# Patient Record
Sex: Male | Born: 1965 | Race: White | Hispanic: No | Smoking: Current some day smoker
Health system: Southern US, Community
[De-identification: ages and names within clinical notes are randomized; demographics above are authoritative.]

## PROBLEM LIST (undated history)

## (undated) HISTORY — PX: APPENDECTOMY: SHX54

---

## 2002-08-29 ENCOUNTER — Emergency Department (HOSPITAL_COMMUNITY): Admission: EM | Admit: 2002-08-29 | Discharge: 2002-08-29 | Payer: Self-pay | Admitting: Emergency Medicine

## 2003-09-24 ENCOUNTER — Encounter: Payer: Self-pay | Admitting: Emergency Medicine

## 2003-09-24 ENCOUNTER — Emergency Department (HOSPITAL_COMMUNITY): Admission: EM | Admit: 2003-09-24 | Discharge: 2003-09-24 | Payer: Self-pay | Admitting: Emergency Medicine

## 2004-07-24 ENCOUNTER — Emergency Department (HOSPITAL_COMMUNITY): Admission: EM | Admit: 2004-07-24 | Discharge: 2004-07-24 | Payer: Self-pay | Admitting: Emergency Medicine

## 2006-02-27 ENCOUNTER — Emergency Department (HOSPITAL_COMMUNITY): Admission: EM | Admit: 2006-02-27 | Discharge: 2006-02-27 | Payer: Self-pay | Admitting: Family Medicine

## 2008-12-29 ENCOUNTER — Emergency Department (HOSPITAL_COMMUNITY): Admission: EM | Admit: 2008-12-29 | Discharge: 2008-12-29 | Payer: Self-pay | Admitting: Emergency Medicine

## 2014-06-03 ENCOUNTER — Emergency Department (HOSPITAL_COMMUNITY)
Admission: EM | Admit: 2014-06-03 | Discharge: 2014-06-04 | Disposition: A | Discharge status: Home / Self Care | Payer: Medicaid Other | Attending: Emergency Medicine | Admitting: Emergency Medicine

## 2014-06-03 ENCOUNTER — Encounter (HOSPITAL_COMMUNITY): Payer: Self-pay | Admitting: Emergency Medicine

## 2014-06-03 DIAGNOSIS — R110 Nausea: Secondary | ICD-10-CM | POA: Insufficient documentation

## 2014-06-03 DIAGNOSIS — Z79899 Other long term (current) drug therapy: Secondary | ICD-10-CM | POA: Insufficient documentation

## 2014-06-03 DIAGNOSIS — Z9089 Acquired absence of other organs: Secondary | ICD-10-CM | POA: Insufficient documentation

## 2014-06-03 DIAGNOSIS — R630 Anorexia: Secondary | ICD-10-CM | POA: Insufficient documentation

## 2014-06-03 DIAGNOSIS — R197 Diarrhea, unspecified: Secondary | ICD-10-CM | POA: Insufficient documentation

## 2014-06-03 LAB — CBC WITH DIFFERENTIAL/PLATELET
Basophils Absolute: 0 10*3/uL (ref 0.0–0.1)
Basophils Relative: 1 % (ref 0–1)
Eosinophils Absolute: 0.1 10*3/uL (ref 0.0–0.7)
Eosinophils Relative: 2 % (ref 0–5)
HCT: 42.9 % (ref 39.0–52.0)
Hemoglobin: 15.4 g/dL (ref 13.0–17.0)
Lymphocytes Relative: 31 % (ref 12–46)
Lymphs Abs: 1.2 10*3/uL (ref 0.7–4.0)
MCH: 29.8 pg (ref 26.0–34.0)
MCHC: 35.9 g/dL (ref 30.0–36.0)
MCV: 83 fL (ref 78.0–100.0)
Monocytes Absolute: 0.5 10*3/uL (ref 0.1–1.0)
Monocytes Relative: 13 % — ABNORMAL HIGH (ref 3–12)
Neutro Abs: 2.1 10*3/uL (ref 1.7–7.7)
Neutrophils Relative %: 53 % (ref 43–77)
Platelets: 250 10*3/uL (ref 150–400)
RBC: 5.17 MIL/uL (ref 4.22–5.81)
RDW: 12.8 % (ref 11.5–15.5)
WBC: 3.8 10*3/uL — ABNORMAL LOW (ref 4.0–10.5)

## 2014-06-03 LAB — I-STAT CHEM 8, ED
BUN: 10 mg/dL (ref 6–23)
BUN: 10 mg/dL (ref 6–23)
CALCIUM ION: 1.08 mmol/L — AB (ref 1.12–1.23)
CREATININE: 1 mg/dL (ref 0.50–1.35)
Calcium, Ion: 1.19 mmol/L (ref 1.12–1.23)
Chloride: 99 mEq/L (ref 96–112)
Chloride: 104 mEq/L (ref 96–112)
Creatinine, Ser: 1 mg/dL (ref 0.50–1.35)
Glucose, Bld: 100 mg/dL — ABNORMAL HIGH (ref 70–99)
Glucose, Bld: 83 mg/dL (ref 70–99)
HCT: 45 % (ref 39.0–52.0)
HCT: 45 % (ref 39.0–52.0)
Hemoglobin: 15.3 g/dL (ref 13.0–17.0)
Hemoglobin: 15.3 g/dL (ref 13.0–17.0)
Potassium: 3.5 mEq/L — ABNORMAL LOW (ref 3.7–5.3)
Potassium: 3.3 mEq/L — ABNORMAL LOW (ref 3.7–5.3)
Sodium: 140 mEq/L (ref 137–147)
Sodium: 138 mEq/L (ref 137–147)
TCO2: 26 mmol/L (ref 0–100)
TCO2: 27 mmol/L (ref 0–100)

## 2014-06-03 MED ORDER — KETOROLAC TROMETHAMINE 30 MG/ML IJ SOLN
30.0000 mg | Freq: Once | INTRAMUSCULAR | Status: AC
  Administered 2014-06-03: 30 mg via INTRAVENOUS
  Filled 2014-06-03: qty 1

## 2014-06-03 MED ORDER — SODIUM CHLORIDE 0.9 % IV BOLUS (SEPSIS)
1000.0000 mL | Freq: Once | INTRAVENOUS | Status: AC
  Administered 2014-06-03: 1000 mL via INTRAVENOUS

## 2014-06-03 NOTE — ED Notes (Signed)
PT reports n/v/d the morning after eating out hibachi on [place]. PT began to feel a tiny bit better and has held down pudding and water today. Stomach still very sensitive and PT states still nauseous. Reports having gotten really hot a few times but unsure of fever. PT says he still has mild nausea and and abd cramping but no only diarrhea x 1 today

## 2014-06-03 NOTE — ED Provider Notes (Signed)
CSN: [id number]     Arrival date & time 06/03/14  2000 History   First MD Initiated Contact with Patient 06/03/14 2133     Chief Complaint  Patient presents with  . Abdominal Pain     (Consider location/radiation/quality/duration/timing/severity/associated sxs/prior Treatment) Patient is a 48 y.o. male presenting with abdominal pain. The history is provided by the patient.  Abdominal Pain Pain location:  Generalized Pain quality: aching and cramping   Pain severity:  Mild Onset quality:  Gradual Duration:  3 days Timing:  Constant Context: not diet changes and not trauma   Relieved by:  None tried Worsened by:  Nothing tried Ineffective treatments:  None tried Associated symptoms: anorexia, diarrhea and nausea   Associated symptoms: no dysuria and no vomiting     History reviewed. No pertinent past medical history. Past Surgical History  Procedure Laterality Date  . Appendectomy     History reviewed. No pertinent family history. History  Substance Use Topics  . Smoking status: Never Smoker   . Smokeless tobacco: Not on file  . Alcohol Use: No    Review of Systems  Gastrointestinal: Positive for nausea, abdominal pain, diarrhea and anorexia. Negative for vomiting.  Genitourinary: Negative for dysuria.  All other systems reviewed and are negative.     Allergies  Review of patient's allergies indicates no known allergies.  Home Medications   Prior to Admission medications   Medication Sig Start Date End Date Taking? Authorizing Provider  BIOTIN PO Take 10,000 mg by mouth daily.   Yes Historical Provider, MD  Emollient (ROC RETINOL CORREXION EYE) CREA Apply 1 application topically at bedtime. Apply to eye lids and under eyes   Yes Historical Provider, MD  minoxidil (ROGAINE) 2 % external solution Apply 1 application topically at bedtime.   Yes Historical Provider, MD  Multiple Vitamin (MULTIVITAMIN WITH MINERALS) TABS tablet Take 1 tablet by mouth daily with  supper.   Yes Historical Provider, MD  OVER THE COUNTER MEDICATION Take 1 capsule by mouth 2 (two) times daily before a meal. Garcinia Cambogia:  Plant extract diet aid (fat blocker)   Yes Historical Provider, MD  OVER THE COUNTER MEDICATION Place 1 spray into both nostrils daily as needed (congestion). Decongestant nasal spray otc   Yes Historical Provider, MD   BP 114/71  Pulse 91  Temp(Src) 98.4 F (36.9 C) (Oral)  Resp 15  Wt 190 lb 9 oz (86.439 kg)  SpO2 98% Physical Exam  Vitals reviewed. Constitutional: He appears well-developed and well-nourished.  HENT:  Head: Normocephalic.  Eyes: Pupils are equal, round, and reactive to light.  Neck: Normal range of motion.  Cardiovascular: Normal rate.   Pulmonary/Chest: Effort normal.  Abdominal: Soft. Bowel sounds are normal. He exhibits no distension. There is tenderness.  Neurological: He is alert.  Skin: Skin is warm. No rash noted.    ED Course  Procedures (including critical care time) Labs Review Labs Reviewed  CBC WITH DIFFERENTIAL - Abnormal; Notable for the following:    WBC 3.8 (*)    Monocytes Relative 13 (*)    All other components within normal limits  I-STAT CHEM 8, ED - Abnormal; Notable for the following:    Potassium 3.5 (*)    Glucose, Bld 100 (*)    All other components within normal limits  I-STAT CHEM 8, ED - Abnormal; Notable for the following:    Potassium 3.3 (*)    Calcium, Ion 1.08 (*)    All other components within normal  limits    Imaging Review No results found.   EKG Interpretation None      MDM  Patient/within normal, parameters.  He's been given IV fluid, and Toradol.  He is feeling, better, and requesting food, and fluids.  I anticipate discharge home after 1 L of fluid Final diagnoses:  Diarrhea         Arman FilterGail K Schulz, NP 06/03/14 2309  Arman FilterGail K Schulz, NP 06/03/14 2351

## 2014-06-03 NOTE — ED Notes (Signed)
Patient with abdominal pain with nausea and diarrhea.  Patient states that he has had this for about 3 days.

## 2014-06-15 NOTE — ED Provider Notes (Signed)
Medical screening examination/treatment/procedure(s) were performed by non-physician practitioner and as supervising physician I was immediately available for consultation/collaboration.   EKG Interpretation None       Vanetta MuldersScott Zackowski, MD 06/15/14 2202

## 2015-07-09 ENCOUNTER — Emergency Department (HOSPITAL_COMMUNITY)
Admission: EM | Admit: 2015-07-09 | Discharge: 2015-07-09 | Disposition: A | Discharge status: Home / Self Care | Payer: Medicaid Other | Attending: Emergency Medicine | Admitting: Emergency Medicine

## 2015-07-09 ENCOUNTER — Emergency Department (HOSPITAL_COMMUNITY): Payer: Medicaid Other

## 2015-07-09 ENCOUNTER — Encounter (HOSPITAL_COMMUNITY): Payer: Self-pay | Admitting: Nurse Practitioner

## 2015-07-09 DIAGNOSIS — R05 Cough: Secondary | ICD-10-CM | POA: Diagnosis present

## 2015-07-09 DIAGNOSIS — Z720 Tobacco use: Secondary | ICD-10-CM | POA: Insufficient documentation

## 2015-07-09 DIAGNOSIS — J40 Bronchitis, not specified as acute or chronic: Secondary | ICD-10-CM

## 2015-07-09 DIAGNOSIS — J209 Acute bronchitis, unspecified: Secondary | ICD-10-CM | POA: Diagnosis not present

## 2015-07-09 MED ORDER — BENZONATATE 200 MG PO CAPS: 200.0000 mg | ORAL_CAPSULE | Freq: Three times a day (TID) | ORAL | Status: AC | PRN

## 2015-07-09 MED ORDER — ALBUTEROL SULFATE HFA 108 (90 BASE) MCG/ACT IN AERS
2.0000 | INHALATION_SPRAY | RESPIRATORY_TRACT | Status: DC | PRN
  Administered 2015-07-09: 2 via RESPIRATORY_TRACT
  Filled 2015-07-09: qty 6.7

## 2015-07-09 MED ORDER — AZITHROMYCIN 250 MG PO TABS: 250.0000 mg | ORAL_TABLET | Freq: Every day | ORAL | Status: AC

## 2015-07-09 MED ORDER — HYDROCOD POLST-CPM POLST ER 10-8 MG/5ML PO SUER
5.0000 mL | Freq: Once | ORAL | Status: DC
  Filled 2015-07-09: qty 5

## 2015-07-09 MED ORDER — DEXTROMETHORPHAN-GUAIFENESIN 10-100 MG/5ML PO LIQD: 5.0000 mL | Freq: Every evening | ORAL | Status: AC | PRN

## 2015-07-09 NOTE — ED Notes (Signed)
He states hes had a dry cough for past month. Denies pain, fevers, weight loss, other symptoms. A&Ox4, resp e/u

## 2015-07-09 NOTE — Discharge Instructions (Signed)
Follow up with your doctor, return here as needed.  °

## 2015-07-09 NOTE — ED Provider Notes (Signed)
CSN: [id number]     Arrival date & time 07/09/15  1713 History  This chart was scribed for Kerrie Buffalo, NP working with Pricilla Loveless, MD by Placido Sou, ED Scribe. This patient was seen in room TR10C/TR10C and the patient's care was started at 7:05 PM.   Chief Complaint  Patient presents with  . Cough   The history is provided by the patient. No language interpreter was used.    HPI Comments: Jimmy Burgess is a 49 y.o. male, with a hx of smoking and bronchitis, who presents to the Emergency Department complaining of a moderate, intermittent, unproductive cough that began in February and began worsening 2 weeks ago. He notes drinking an "immune boosting" tea recently and felt a worsening of his symptoms s/p and further notes an associated sore throat due to the heavy cough and an intermittent SOB when performing certain actions such as bending over to tie his shoes. He also notes sometimes coughing up small amounts of blood in his mucous due to occasional bouts of epistaxis that drain down his throat. Pt notes that s/p ambulating once awake in the morning his symptoms tend to worsen. Pt also notes living in an old home and is concerned that the air quality in the home could be unsatisfactory and possibly exacerbating his symptoms. He denies any change in his symptoms when exposed to heat or cold. He notes a hx of smoking but denies any current regular smoking. Pt denies having seen his PCP for these symptoms. He denies weight loss, chills, ear ache or fever.   History reviewed. No pertinent past medical history. Past Surgical History  Procedure Laterality Date  . Appendectomy     History reviewed. No pertinent family history. History  Substance Use Topics  . Smoking status: Current Some Day Smoker    Types: Cigarettes  . Smokeless tobacco: Not on file  . Alcohol Use: No    Review of Systems  Constitutional: Negative for fever and chills.  HENT: Positive for sore throat. Negative for ear  pain.   Respiratory: Positive for cough, shortness of breath and wheezing.   Endocrine: Negative for cold intolerance and heat intolerance.  All other systems reviewed and are negative.   Allergies  Review of patient's allergies indicates no known allergies.  Home Medications   Prior to Admission medications   Medication Sig Start Date End Date Taking? Authorizing Provider  azithromycin (ZITHROMAX) 250 MG tablet Take 1 tablet (250 mg total) by mouth daily. Take first 2 tablets together, then 1 every day until finished. 07/09/15   Ivoree Felmlee Orlene Och, NP  benzonatate (TESSALON) 200 MG capsule Take 1 capsule (200 mg total) by mouth 3 (three) times daily as needed for cough. 07/09/15   Michael Ventresca Orlene Och, NP  BIOTIN PO Take 10,000 mg by mouth daily.    Historical Provider, MD  dextromethorphan-guaiFENesin (TUSSIN DM) 10-100 MG/5ML liquid Take 5 mLs by mouth at bedtime as needed and may repeat dose one time if needed for cough. 07/09/15   Hassen Bruun Orlene Och, NP  Emollient (ROC RETINOL CORREXION EYE) CREA Apply 1 application topically at bedtime. Apply to eye lids and under eyes    Historical Provider, MD  minoxidil (ROGAINE) 2 % external solution Apply 1 application topically at bedtime.    Historical Provider, MD  Multiple Vitamin (MULTIVITAMIN WITH MINERALS) TABS tablet Take 1 tablet by mouth daily with supper.    Historical Provider, MD  OVER THE COUNTER MEDICATION Take 1 capsule by mouth 2 (  two) times daily before a meal. Garcinia Cambogia:  Plant extract diet aid (fat blocker)    Historical Provider, MD  OVER THE COUNTER MEDICATION Place 1 spray into both nostrils daily as needed (congestion). Decongestant nasal spray otc    Historical Provider, MD   BP 111/78 mmHg  Pulse 108  Temp(Src) 98.4 F (36.9 C) (Oral)  Resp 20  SpO2 94% Physical Exam  Constitutional: He is oriented to person, place, and time. He appears well-developed and well-nourished. No distress.  HENT:  Head: Normocephalic and atraumatic.   Mouth/Throat: Oropharynx is clear and moist.  Eyes: Conjunctivae and EOM are normal. Pupils are equal, round, and reactive to light.  Neck: Normal range of motion. Neck supple. No tracheal deviation present.  Cardiovascular: Normal rate.   Pulmonary/Chest: Effort normal. No respiratory distress. He has wheezes.  Occasional wheezing;   Abdominal: Soft.  Musculoskeletal: Normal range of motion.  Neurological: He is alert and oriented to person, place, and time.  Skin: Skin is warm and dry.  Psychiatric: He has a normal mood and affect. His behavior is normal.  Nursing note and vitals reviewed.   ED Course  Procedures  DIAGNOSTIC STUDIES: Oxygen Saturation is 94% on RA, adequate by my interpretation.    COORDINATION OF CARE: 7:16 PM Discussed treatment plan with pt at bedside and pt agreed to plan.  Labs Review Labs Reviewed - No data to display  Imaging Review Dg Chest 2 View  07/09/2015   CLINICAL DATA:  Nonproductive cough, headache, shortness of breath, and chest pressure for 1 month. Former smoker.  EXAM: CHEST  2 VIEW  COMPARISON:  None.  FINDINGS: Cardiomediastinal silhouette is within normal limits. Minimal atelectasis or scarring is noted in the lung bases. No confluent airspace opacity, pulmonary edema, pleural effusion, or pneumothorax is identified. S shaped thoracolumbar scoliosis is noted.  IMPRESSION: No active cardiopulmonary disease.   Electronically Signed   By: Sebastian Ache   On: 07/09/2015 18:03     MDM  49 y.o. male with cough and wheezing stable for d/c without respiratory distress, O2 SAT 94% on R/A. Will treat for bronchitis with albuterol inhaler, z-pak, tessalon and Tussionex at bedtime. He will follow up with his doctor this week. He will return here as needed.   Final diagnoses:  Bronchitis   I personally performed the services described in this documentation, which was scribed in my presence. The recorded information has been reviewed and is  accurate.    [address] [place], [place] 07/10/15 1730  Pricilla Loveless, MD 07/12/15 [phone]

## 2015-07-09 NOTE — ED Notes (Signed)
Patient transported to X-ray 

## 2016-04-04 ENCOUNTER — Encounter: Payer: Self-pay | Admitting: Internal Medicine

## 2016-07-09 ENCOUNTER — Emergency Department (HOSPITAL_COMMUNITY): Payer: No Typology Code available for payment source

## 2016-07-09 ENCOUNTER — Encounter (HOSPITAL_COMMUNITY): Payer: Self-pay | Admitting: Emergency Medicine

## 2016-07-09 ENCOUNTER — Emergency Department (HOSPITAL_COMMUNITY)
Admission: EM | Admit: 2016-07-09 | Discharge: 2016-07-09 | Disposition: A | Discharge status: Home / Self Care | Payer: No Typology Code available for payment source | Attending: Emergency Medicine | Admitting: Emergency Medicine

## 2016-07-09 DIAGNOSIS — S0990XA Unspecified injury of head, initial encounter: Secondary | ICD-10-CM | POA: Diagnosis present

## 2016-07-09 DIAGNOSIS — S0181XA Laceration without foreign body of other part of head, initial encounter: Secondary | ICD-10-CM | POA: Diagnosis not present

## 2016-07-09 DIAGNOSIS — Y999 Unspecified external cause status: Secondary | ICD-10-CM | POA: Insufficient documentation

## 2016-07-09 DIAGNOSIS — F17210 Nicotine dependence, cigarettes, uncomplicated: Secondary | ICD-10-CM | POA: Insufficient documentation

## 2016-07-09 DIAGNOSIS — Y92410 Unspecified street and highway as the place of occurrence of the external cause: Secondary | ICD-10-CM | POA: Insufficient documentation

## 2016-07-09 DIAGNOSIS — Y939 Activity, unspecified: Secondary | ICD-10-CM | POA: Insufficient documentation

## 2016-07-09 DIAGNOSIS — N400 Enlarged prostate without lower urinary tract symptoms: Secondary | ICD-10-CM | POA: Diagnosis not present

## 2016-07-09 DIAGNOSIS — IMO0002 Reserved for concepts with insufficient information to code with codable children: Secondary | ICD-10-CM

## 2016-07-09 LAB — CBC WITH DIFFERENTIAL/PLATELET
BASOS PCT: 2 %
Basophils Absolute: 0.1 10*3/uL (ref 0.0–0.1)
Eosinophils Absolute: 0.1 10*3/uL (ref 0.0–0.7)
Eosinophils Relative: 2 %
HEMATOCRIT: 43.6 % (ref 39.0–52.0)
HEMOGLOBIN: 15.2 g/dL (ref 13.0–17.0)
LYMPHS PCT: 36 %
Lymphs Abs: 2.1 10*3/uL (ref 0.7–4.0)
MCH: 29.6 pg (ref 26.0–34.0)
MCHC: 34.9 g/dL (ref 30.0–36.0)
MCV: 85 fL (ref 78.0–100.0)
MONO ABS: 0.7 10*3/uL (ref 0.1–1.0)
MONOS PCT: 11 %
NEUTROS ABS: 3 10*3/uL (ref 1.7–7.7)
NEUTROS PCT: 49 %
Platelets: 281 10*3/uL (ref 150–400)
RBC: 5.13 MIL/uL (ref 4.22–5.81)
RDW: 12.6 % (ref 11.5–15.5)
WBC: 6 10*3/uL (ref 4.0–10.5)

## 2016-07-09 LAB — COMPREHENSIVE METABOLIC PANEL
ALBUMIN: 4.1 g/dL (ref 3.5–5.0)
ALK PHOS: 57 U/L (ref 38–126)
ALT: 26 U/L (ref 17–63)
ANION GAP: 10 (ref 5–15)
AST: 24 U/L (ref 15–41)
BILIRUBIN TOTAL: 1 mg/dL (ref 0.3–1.2)
BUN: 12 mg/dL (ref 6–20)
CALCIUM: 8.8 mg/dL — AB (ref 8.9–10.3)
CO2: 21 mmol/L — ABNORMAL LOW (ref 22–32)
Chloride: 106 mmol/L (ref 101–111)
Creatinine, Ser: 1.21 mg/dL (ref 0.61–1.24)
GFR calc Af Amer: >60 mL/min (ref >60)
Glucose, Bld: 142 mg/dL — ABNORMAL HIGH (ref 65–99)
Potassium: 3.2 mmol/L — ABNORMAL LOW (ref 3.5–5.1)
Sodium: 137 mmol/L (ref 135–145)
TOTAL PROTEIN: 6.2 g/dL — AB (ref 6.5–8.1)

## 2016-07-09 LAB — LIPASE, BLOOD: LIPASE: 196 U/L — AB (ref 11–51)

## 2016-07-09 MED ORDER — TETANUS-DIPHTH-ACELL PERTUSSIS 5-2.5-18.5 LF-MCG/0.5 IM SUSP
0.5000 mL | Freq: Once | INTRAMUSCULAR | Status: AC
  Administered 2016-07-09: 0.5 mL via INTRAMUSCULAR
  Filled 2016-07-09: qty 0.5

## 2016-07-09 MED ORDER — FENTANYL CITRATE (PF) 100 MCG/2ML IJ SOLN
INTRAMUSCULAR | Status: AC
  Filled 2016-07-09: qty 2

## 2016-07-09 MED ORDER — FENTANYL CITRATE (PF) 100 MCG/2ML IJ SOLN
100.0000 ug | Freq: Once | INTRAMUSCULAR | Status: AC
  Administered 2016-07-09: 100 ug via INTRAVENOUS

## 2016-07-09 MED ORDER — TETANUS-DIPHTHERIA TOXOIDS TD 5-2 LFU IM INJ: 0.5000 mL | INJECTION | Freq: Once | INTRAMUSCULAR | Status: DC

## 2016-07-09 MED ORDER — IOPAMIDOL (ISOVUE-300) INJECTION 61%
INTRAVENOUS | Status: AC
  Administered 2016-07-09: 80 mL via INTRAVENOUS
  Filled 2016-07-09: qty 100

## 2016-07-09 MED ORDER — LIDOCAINE-EPINEPHRINE (PF) 2 %-1:200000 IJ SOLN
20.0000 mL | Freq: Once | INTRAMUSCULAR | Status: AC
  Administered 2016-07-09: 20 mL
  Filled 2016-07-09: qty 20

## 2016-07-09 MED ORDER — CYCLOBENZAPRINE HCL 10 MG PO TABS: 10.0000 mg | ORAL_TABLET | Freq: Two times a day (BID) | ORAL | 0 refills | Status: AC | PRN

## 2016-07-09 MED ORDER — SODIUM CHLORIDE 0.9 % IV BOLUS (SEPSIS)
1000.0000 mL | Freq: Once | INTRAVENOUS | Status: AC
Start: 2016-07-09 — End: 2016-07-09
  Administered 2016-07-09: 1000 mL via INTRAVENOUS

## 2016-07-09 MED ORDER — BACITRACIN ZINC 500 UNIT/GM EX OINT: 1.0000 "application " | TOPICAL_OINTMENT | Freq: Two times a day (BID) | CUTANEOUS | 0 refills | Status: AC

## 2016-07-09 MED ORDER — BACITRACIN ZINC 500 UNIT/GM EX OINT
TOPICAL_OINTMENT | Freq: Once | CUTANEOUS | Status: AC
Start: 2016-07-09 — End: 2016-07-09
  Administered 2016-07-09: 1 via TOPICAL

## 2016-07-09 NOTE — ED Notes (Signed)
Pt transported to CT ?

## 2016-07-09 NOTE — ED Notes (Signed)
MD remains at bedside to clean wound and suture/staple head.

## 2016-07-09 NOTE — ED Notes (Signed)
MD asked to come back to bedside to apply 2 more stiches beside of laceration to center on head.

## 2016-07-09 NOTE — ED Notes (Signed)
MD at bedside preparing to cleanse and staple scalp.

## 2016-07-09 NOTE — ED Notes (Signed)
PT ambulated in room to sink and pt was able to ambulate with no pain, dizziness or unsteady gait.
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
## 2017-07-13 ENCOUNTER — Emergency Department (HOSPITAL_COMMUNITY): Payer: Medicaid Other

## 2017-07-13 ENCOUNTER — Encounter (HOSPITAL_COMMUNITY): Payer: Self-pay | Admitting: Emergency Medicine

## 2017-07-13 ENCOUNTER — Emergency Department (HOSPITAL_COMMUNITY)
Admission: EM | Admit: 2017-07-13 | Discharge: 2017-07-13 | Disposition: A | Discharge status: Home / Self Care | Payer: Medicaid Other | Attending: Emergency Medicine | Admitting: Emergency Medicine

## 2017-07-13 DIAGNOSIS — F17210 Nicotine dependence, cigarettes, uncomplicated: Secondary | ICD-10-CM | POA: Diagnosis not present

## 2017-07-13 DIAGNOSIS — M25561 Pain in right knee: Secondary | ICD-10-CM | POA: Diagnosis not present

## 2017-07-13 DIAGNOSIS — Z79899 Other long term (current) drug therapy: Secondary | ICD-10-CM | POA: Insufficient documentation

## 2017-07-13 MED ORDER — NAPROXEN 375 MG PO TABS: 375.0000 mg | ORAL_TABLET | Freq: Two times a day (BID) | ORAL | 0 refills | Status: AC

## 2017-07-13 NOTE — ED Triage Notes (Signed)
Pt. Stated, I've always had trouble with my right knee I could pop it back in when it cam out of joint , but its been bothering me for over a week

## 2017-07-13 NOTE — ED Notes (Signed)
Pt taken to XRay 

## 2017-07-13 NOTE — ED Provider Notes (Signed)
[facility]-EMERGENCY DEPT Provider Note   CSN: [id number] Arrival date & time: 07/13/17  1204   By signing my name below, I, Clarisse GougeXavier Herndon, attest that this documentation has been prepared under the direction and in the presence of Rolan BuccoBelfi, Lamari Youngers, MD. Electronically signed, Clarisse GougeXavier Herndon, ED Scribe. 07/13/17. 12:56 PM.  History   Chief Complaint Chief Complaint  Patient presents with  . Knee Pain   The history is provided by the patient and medical records. No language interpreter was used.    Jimmy Burgess is a 51 y.o. male presenting to the Emergency Department concerning acute on chronic L knee pain x ~1 week. He states this came on while resting on his knees to work on his car. Associated stiffness. He describes 5/10, constant aches worse with ambulation and resting his weight on the knees.  No PTA medications. No orthopedic noted for F/U. No recent trauma or injury. No numbness, weakness or other complaints at this time.   History reviewed. No pertinent past medical history.  There are no active problems to display for this patient.   Past Surgical History:  Procedure Laterality Date  . APPENDECTOMY         Home Medications    Prior to Admission medications   Medication Sig Start Date End Date Taking? Authorizing Provider  azithromycin (ZITHROMAX) 250 MG tablet Take 1 tablet (250 mg total) by mouth daily. Take first 2 tablets together, then 1 every day until finished. 07/09/15   Janne NapoleonNeese, Hope M, NP  bacitracin ointment Apply 1 application topically 2 (two) times daily. For 7 days. 07/09/16   Tery Sanfilippoiester, Matthew, MD  benzonatate (TESSALON) 200 MG capsule Take 1 capsule (200 mg total) by mouth 3 (three) times daily as needed for cough. 07/09/15   Janne NapoleonNeese, Hope M, NP  BIOTIN PO Take 10,000 mg by mouth daily.    [provider]  cyclobenzaprine (FLEXERIL) 10 MG tablet Take 1 tablet (10 mg total) by mouth 2 (two) times daily as needed for muscle spasms. 07/09/16   Tery Sanfilippoiester, Matthew,  MD  dextromethorphan-guaiFENesin (TUSSIN DM) 10-100 MG/5ML liquid Take 5 mLs by mouth at bedtime as needed and may repeat dose one time if needed for cough. 07/09/15   Janne NapoleonNeese, Hope M, NP  Emollient (ROC RETINOL CORREXION EYE) CREA Apply 1 application topically at bedtime. Apply to eye lids and under eyes    [provider]  minoxidil (ROGAINE) 2 % external solution Apply 1 application topically at bedtime.    [provider]  Multiple Vitamin (MULTIVITAMIN WITH MINERALS) TABS tablet Take 1 tablet by mouth daily with supper.    [provider]  naproxen (NAPROSYN) 375 MG tablet Take 1 tablet (375 mg total) by mouth 2 (two) times daily. 07/13/17   Rolan BuccoBelfi, Lindamarie Maclachlan, MD  OVER THE COUNTER MEDICATION Take 1 capsule by mouth 2 (two) times daily before a meal. Garcinia Cambogia:  Plant extract diet aid (fat blocker)    [provider]  OVER THE COUNTER MEDICATION Place 1 spray into both nostrils daily as needed (congestion). Decongestant nasal spray otc    [provider]    Family History No family history on file.  Social History Social History  Substance Use Topics  . Smoking status: Current Some Day Smoker    Types: Cigarettes  . Smokeless tobacco: Current User  . Alcohol use No     Allergies   Patient has no known allergies.   Review of Systems Review of Systems  Constitutional: Negative for  chills, diaphoresis and fever.  Respiratory: Negative for shortness of breath.   Gastrointestinal: Negative for nausea and vomiting.  Musculoskeletal: Positive for arthralgias. Negative for gait problem and joint swelling.  Skin: Negative for color change and wound.  Neurological: Negative for weakness and numbness.     Physical Exam Updated Vital Signs BP 122/75 (BP Location: Right Arm)   Pulse 96   Temp 98 F (36.7 C) (Oral)   Resp 16   Ht 5\' 7"  (1.702 m)   Wt 195 lb (88.5 kg)   SpO2 97%   BMI 30.54 kg/m   Physical Exam  Constitutional: He  is oriented to person, place, and time. He appears well-developed and well-nourished.  HENT:  Head: Normocephalic and atraumatic.  Neck: Normal range of motion. Neck supple.  Cardiovascular: Normal rate.   Pulmonary/Chest: Effort normal.  Musculoskeletal: He exhibits tenderness. He exhibits no edema.  Mild tenderness on ROM of R knee. No effusion, warmth or erythema. No crepitus on ROM. No ligament laxity. Is able to to do straight leg raise. NVI distally.  Neurological: He is alert and oriented to person, place, and time.  Skin: Skin is warm and dry.  Psychiatric: He has a normal mood and affect.     ED Treatments / Results  DIAGNOSTIC STUDIES: Oxygen Saturation is 97% on RA, NL by my interpretation.    COORDINATION OF CARE: 12:51 PM-Discussed next steps with pt. Pt verbalized understanding and is agreeable with the plan. Will order imaging, knee sleeve and refer to orthopedist.   Labs (all labs ordered are listed, but only abnormal results are displayed) Labs Reviewed - No data to display  EKG  EKG Interpretation None       Radiology Dg Knee Complete 4 Views Right  Result Date: 07/13/2017 CLINICAL DATA:  Right knee pain for over a month. EXAM: RIGHT KNEE - COMPLETE 4+ VIEW COMPARISON:  None. FINDINGS: No evidence of fracture, dislocation, or joint effusion. No evidence of arthropathy or other focal bone abnormality. Soft tissues are unremarkable. IMPRESSION: Negative. Electronically Signed   By: Charlett NoseKevin  Dover M.D.   On: 07/13/2017 13:21    Procedures Procedures (including critical care time)  Medications Ordered in ED Medications - No data to display   Initial Impression / Assessment and Plan / ED Course  I have reviewed the triage vital signs and the nursing notes.  Pertinent labs & imaging results that were available during my care of the patient were reviewed by me and considered in my medical decision making (see chart for details).     Patient pain to his  right knee. There is no effusion. No bony injury noted. No ligament laxity. He could have a meniscal tear although I don't find clinical evidence of this. He was placed in a knee sleeve. He was advised in ice and elevation. He was given a prescription for Naprosyn. He was given a referral to follow-up with orthopedics.  Final Clinical Impressions(s) / ED Diagnoses   Final diagnoses:  Acute pain of right knee    New Prescriptions New Prescriptions   NAPROXEN (NAPROSYN) 375 MG TABLET    Take 1 tablet (375 mg total) by mouth 2 (two) times daily.  I personally performed the services described in this documentation, which was scribed in my presence.  The recorded information has been reviewed and considered.    Rolan BuccoBelfi, Laria Grimmett, MD 07/13/17 1345

## 2017-07-13 NOTE — ED Notes (Signed)
MD Belfi at the bedside  
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
## 2019-02-17 ENCOUNTER — Other Ambulatory Visit: Payer: Self-pay

## 2019-02-17 ENCOUNTER — Encounter (HOSPITAL_COMMUNITY): Payer: Self-pay | Admitting: Emergency Medicine

## 2019-02-17 ENCOUNTER — Emergency Department (HOSPITAL_COMMUNITY)
Admission: EM | Admit: 2019-02-17 | Discharge: 2019-02-18 | Discharge status: Against Medical Advice | Payer: Medicaid Other | Attending: Emergency Medicine | Admitting: Emergency Medicine

## 2019-02-17 DIAGNOSIS — Z5321 Procedure and treatment not carried out due to patient leaving prior to being seen by health care provider: Secondary | ICD-10-CM | POA: Insufficient documentation

## 2019-02-17 DIAGNOSIS — M549 Dorsalgia, unspecified: Secondary | ICD-10-CM | POA: Diagnosis present

## 2019-02-17 NOTE — ED Triage Notes (Signed)
Pt reports back pain that started this morning while coughing. Denies bowel/bladder changes, ambulatory without difficulty.

## 2019-02-18 NOTE — ED Notes (Signed)
Pt to the desk, states he is leaving, encouraged to stay, refused.

## 2019-11-30 ENCOUNTER — Encounter (HOSPITAL_COMMUNITY): Payer: Self-pay | Admitting: Psychiatry

## 2019-11-30 ENCOUNTER — Ambulatory Visit (HOSPITAL_COMMUNITY)
Admission: RE | Admit: 2019-11-30 | Discharge: 2019-11-30 | Disposition: A | Discharge status: Home / Self Care | Payer: Medicaid Other | Attending: Psychiatry | Admitting: Psychiatry

## 2019-11-30 DIAGNOSIS — F329 Major depressive disorder, single episode, unspecified: Secondary | ICD-10-CM | POA: Diagnosis not present

## 2019-11-30 DIAGNOSIS — R4587 Impulsiveness: Secondary | ICD-10-CM | POA: Diagnosis not present

## 2019-11-30 DIAGNOSIS — R454 Irritability and anger: Secondary | ICD-10-CM | POA: Diagnosis not present

## 2019-11-30 DIAGNOSIS — R45851 Suicidal ideations: Secondary | ICD-10-CM | POA: Diagnosis not present

## 2019-11-30 DIAGNOSIS — F419 Anxiety disorder, unspecified: Secondary | ICD-10-CM | POA: Insufficient documentation
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
## 2019-11-30 NOTE — H&P (Signed)
Behavioral Health Medical Screening Exam  Jimmy Burgess is an 53 y.o. male patient presents to [facility] as a walk-in accompanied by his stepdaughter and biological son with complaints of depression and statements of suicidal ideation.  Patient reports he was caught having pitchers of 2 of his stepdaughters friends while using the bathroom from a camera he had placed in the bathroom.  Was patient's daughter found out she reported to the police and charges were filed against patient.  Patient reporting he was upset when he first caught got  and had made a suicidal statement but states that he is feeling better.  Patient does report that he has chronic depression but does not have any outpatient psychiatric services at this time but would like to have outpatient services set up.  At this time patient denies suicidal/self-harm/homicidal ideations, psychosis, paranoia.  Patient is currently living with his stepdaughter but she is asked him to leave the home and is willing to give him 2 weeks to find somewhere else to stay.  Patient son states that he will check in periodically to make sure everything is going okay at home until his father can find somewhere else to stay.  Total Time spent with patient: 30 minutes  Psychiatric Specialty Exam: Physical Exam  Vitals reviewed. Constitutional: He is oriented to person, place, and time. He appears well-developed and well-nourished. No distress.  Respiratory: Effort normal.  Musculoskeletal:        General: Normal range of motion.     Cervical back: Normal range of motion.  Neurological: He is alert and oriented to person, place, and time.  Skin: Skin is warm and dry.  Psychiatric: His speech is normal and behavior is normal. Thought content normal. Cognition and memory are normal. He expresses impulsivity. Depressed: Stable.    Review of Systems  Psychiatric/Behavioral: Agitation:  Denies. Confusion:  Denies. Hallucinations:  Denies. Self-injury: Denies.  Sleep disturbance: Denies. Suicidal ideas: Reports passive thoughts when first got caught with camera but not now. The patient is nervous/anxious (Reports some anxiousness related to what will happen with the police or when he has to go to court).        Patient reports prior history of suicide attempt in 2007 by cutting his wrist and was inpatient at that time but no problems since then.    Blood pressure 108/87, pulse (!) 109, temperature 98.7 F (37.1 C), temperature source Oral, resp. rate 18, SpO2 100 %.There is no height or weight on file to calculate BMI.  General Appearance: Casual  Eye Contact:  Good  Speech:  Clear and Coherent and Normal Rate  Volume:  Normal  Mood:  Anxious, Depressed and Irritable  Affect:  Congruent  Thought Process:  Coherent, Goal Directed and Descriptions of Associations: Intact  Orientation:  Full (Time, Place, and Person)  Thought Content:  WDL  Suicidal Thoughts:  No  Homicidal Thoughts:  No  Memory:  Immediate;   Good Recent;   Good Remote;   Good  Judgement:  Intact  Insight:  Present  Psychomotor Activity:  Normal  Concentration: Concentration: Good and Attention Span: Good  Recall:  Good  Fund of Knowledge:Good  Language: Good  Akathisia:  No  Handed:  Right  AIMS (if indicated):     Assets:  Communication Skills Desire for Improvement Housing Physical Health Social Support  Sleep:       Musculoskeletal: Strength & Muscle Tone: within normal limits Gait & Station: normal Patient leans: N/A  Blood pressure  108/87, pulse (!) 109, temperature 98.7 F (37.1 C), temperature source Oral, resp. rate 18, SpO2 100 %.  Recommendations: Outpatient psychiatric services.  Resources will be given  Based on my evaluation the patient does not appear to have an emergency medical condition.  Disposition: Patient psychiatrically cleared No evidence of imminent risk to self or others at present.   Patient does not meet criteria for psychiatric  inpatient admission. Supportive therapy provided about ongoing stressors. Refer to IOP. Discussed crisis plan, support from social network, calling 911, coming to the Emergency Department, and calling Suicide Hotline.  Annete Ayuso, NP 11/30/2019, 6:35 PM

## 2025-01-18 ENCOUNTER — Other Ambulatory Visit: Payer: Self-pay
